# Patient Record
Sex: Male | Born: 1955 | Race: White | Hispanic: No | Marital: Married | Smoking: Never smoker
Health system: Southern US, Community
[De-identification: ages and names within clinical notes are randomized; demographics above are authoritative.]

## PROBLEM LIST (undated history)

## (undated) DIAGNOSIS — E785 Hyperlipidemia, unspecified: Secondary | ICD-10-CM

## (undated) HISTORY — DX: Hyperlipidemia, unspecified: E78.5

---

## 2015-07-07 ENCOUNTER — Emergency Department (HOSPITAL_COMMUNITY): Payer: Self-pay

## 2015-07-07 ENCOUNTER — Emergency Department (HOSPITAL_COMMUNITY)
Admission: EM | Admit: 2015-07-07 | Discharge: 2015-07-07 | Disposition: A | Discharge status: Home / Self Care | Payer: Self-pay | Attending: Emergency Medicine | Admitting: Emergency Medicine

## 2015-07-07 DIAGNOSIS — S70351A Superficial foreign body, right thigh, initial encounter: Secondary | ICD-10-CM | POA: Insufficient documentation

## 2015-07-07 DIAGNOSIS — Y9389 Activity, other specified: Secondary | ICD-10-CM | POA: Insufficient documentation

## 2015-07-07 DIAGNOSIS — Y998 Other external cause status: Secondary | ICD-10-CM | POA: Insufficient documentation

## 2015-07-07 DIAGNOSIS — S71121A Laceration with foreign body, right thigh, initial encounter: Secondary | ICD-10-CM | POA: Insufficient documentation

## 2015-07-07 DIAGNOSIS — Z23 Encounter for immunization: Secondary | ICD-10-CM | POA: Insufficient documentation

## 2015-07-07 DIAGNOSIS — W208XXA Other cause of strike by thrown, projected or falling object, initial encounter: Secondary | ICD-10-CM | POA: Insufficient documentation

## 2015-07-07 DIAGNOSIS — M795 Residual foreign body in soft tissue: Secondary | ICD-10-CM

## 2015-07-07 DIAGNOSIS — Y9289 Other specified places as the place of occurrence of the external cause: Secondary | ICD-10-CM | POA: Insufficient documentation

## 2015-07-07 MED ORDER — OXYCODONE-ACETAMINOPHEN 5-325 MG PO TABS
1.0000 | ORAL_TABLET | Freq: Once | ORAL | Status: AC
  Administered 2015-07-07: 1 via ORAL
  Filled 2015-07-07: qty 1

## 2015-07-07 MED ORDER — DOXYCYCLINE HYCLATE 100 MG PO TABS
100.0000 mg | ORAL_TABLET | Freq: Once | ORAL | Status: AC
  Administered 2015-07-07: 100 mg via ORAL
  Filled 2015-07-07: qty 1

## 2015-07-07 MED ORDER — BUPIVACAINE HCL (PF) 0.5 % IJ SOLN
20.0000 mL | Freq: Once | INTRAMUSCULAR | Status: AC
  Administered 2015-07-07: 20 mL
  Filled 2015-07-07: qty 20

## 2015-07-07 MED ORDER — TETANUS-DIPHTH-ACELL PERTUSSIS 5-2.5-18.5 LF-MCG/0.5 IM SUSP
0.5000 mL | Freq: Once | INTRAMUSCULAR | Status: AC
  Administered 2015-07-07: 0.5 mL via INTRAMUSCULAR
  Filled 2015-07-07: qty 0.5

## 2015-07-07 MED ORDER — OXYCODONE-ACETAMINOPHEN 5-325 MG PO TABS: 1.0000 | ORAL_TABLET | ORAL | Status: DC | PRN

## 2015-07-07 MED ORDER — DOXYCYCLINE HYCLATE 100 MG PO CAPS: 100.0000 mg | ORAL_CAPSULE | Freq: Two times a day (BID) | ORAL | Status: DC

## 2015-07-07 MED ORDER — IBUPROFEN 800 MG PO TABS: 800.0000 mg | ORAL_TABLET | Freq: Three times a day (TID) | ORAL | Status: DC
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
## 2015-07-07 NOTE — Discharge Instructions (Signed)
You have been seen today for a laceration and foreign body removal.. The follow-up x-ray showed no evidence of remaining foreign body. A PCP in follow-up with his PCP or return to the ED in 3 days for a wound check. Return to ED should symptoms worsen or signs of infection arise, such as redness expanding out from the root wound or red streaks, intense pain, discharge from the wound, or any other major concerns. Return to the ED should you have bleeding that does not stop with at least 10 minutes of direct pressure. Return to the ED immediately by ambulance if you have a large amount of flowing or spurting blood. Please take all of your antibiotics until finished!   You may develop abdominal discomfort or diarrhea from the antibiotic.  You may help offset this with probiotics which you can buy or get in yogurt. Do not eat or take the probiotics until 2 hours after your antibiotic.    Emergency Department Resource Guide 1) Find a Doctor and Pay Out of Pocket Although you won't have to find out who is covered by your insurance plan, it is a good idea to ask around and get recommendations. You will then need to call the office and see if the doctor you have chosen will accept you as a new patient and what types of options they offer for patients who are self-pay. Some doctors offer discounts or will set up payment plans for their patients who do not have insurance, but you will need to ask so you aren't surprised when you get to your appointment.  2) Contact Your Local Health Department Not all health departments have doctors that can see patients for sick visits, but many do, so it is worth a call to see if yours does. If you don't know where your local health department is, you can check in your phone book. The CDC also has a tool to help you locate your state's health department, and many state websites also have listings of all of their local health departments.  3) Find a Walk-in Clinic If your illness  is not likely to be very severe or complicated, you may want to try a walk in clinic. These are popping up all over the country in pharmacies, drugstores, and shopping centers. They're usually staffed by nurse practitioners or physician assistants that have been trained to treat common illnesses and complaints. They're usually fairly quick and inexpensive. However, if you have serious medical issues or chronic medical problems, these are probably not your best option.  No Primary Care Doctor: - Call Health Connect at  [phone] - they can help you locate a primary care [phone]  accepts your insurance, provides certain services, etc. - Physician Referral Service- [phone]  Chronic Pain Problems: Organization         Address  Phone   Notes  [facility]  [phone] Patients need to be referred by their primary care doctor.   Medication Assistance: Organization         Address  Phone   Notes  [facility] [address] [place], [place] [address] [phone] --Must be a resident of [facility] -- Must have NO insurance coverage whatsoever (no Medicaid/ Medicare, etc.) -- The pt. MUST have a primary care doctor that directs their care regularly and follows them in the community   [facility]  [phone]   [address]  [phone]  Agencies that provide inexpensive medical care: Organization         Address  Phone   Notes  [facility]  [phone]   [facility]    [phone]   [facility] [address] [place], [place] [address] [phone]   [facility] [address], [place] [phone]   Planned Parenthood    [phone]   [facility]    [phone]   Community Health and [facility]  [address], [place] Phone:  [phone], Fax:  [phone] Hours of Operation:  9 am - 6  pm, M-F.  Also accepts Medicaid/Medicare and self-pay.  [facility]  [address], [place] Phone: [phone], Fax: [phone]. Hours of Operation:  8:30 am - 5:30 pm, M-F.  Also accepts Medicaid and self-pay.  [facility] [place] [address] [place] Phone: [phone]   [facility] [address] Natasha Bence [place], [place] [phone], Ext. [phone] Mondays & Thursdays: 7-9 AM.  First 15 patients are seen on a first come, first serve basis.    Medicaid-accepting [facility] Providers:  Organization         Address  Phone   Notes  [facility] [address], [place] [phone] Also accepts self-pay patients.  [facility] [address] Laurell Josephs [place], [place]  [phone]   [facility] [address], [place] [phone]   [facility] [address], [place] [phone]   Renaye Rakers [address], [place]   [phone] Only accepts [place] Access [place] patients after they have their name applied to their card.   Self-Pay (no insurance) in [facility] Series:  Organization         Address  Phone   Notes  Sickle Cell Patients, [facility] [address] [place], [place] [phone]   [facility] [address] [place], [place] [phone]   [facility] [place]  [address], [place] [phone]   [facility]/Dr. Osei-Bonsu  [address] [place] or [address], [place] [phone] Phone number for both [place] and [place] locations is the same.  Urgent Medical and [facility] [address], [place] [phone]   [facility] [address], [place] or [address] [phone]   [facility] [address], [place] [phone], phone; [phone], fax Sees patients 1st and 3rd Saturday of every month.  Must not qualify for public or private insurance (i.e. Medicaid, Medicare, [employer] Health Choice, Veterans' Benefits)  Household income should be no more than 200% of the poverty level The clinic cannot treat you if you are pregnant or think you are pregnant  Sexually transmitted diseases are not treated at the clinic.    Dental Care: Organization         Address  Phone  Notes  [facility] Department of [facility] [address] [place], [place] [phone] Accepts children up to age 61 who are enrolled in [place] or [place] Health Choice; pregnant women with a Medicaid card; and children who have applied for Medicaid or  [place] Health Choice, but were declined, whose parents can pay a reduced fee at time of service.  [facility] Department of [facility]  [address], [place] [phone] Accepts children up to age 4 who are enrolled in [place] or [place] Health Choice; pregnant women with a Medicaid card; and children who have applied for Medicaid or  Health Choice, but were declined, whose parents can pay a reduced fee at time of service.  [facility]  [address] [place], [place] [phone] Patients are seen by appointment only. Walk-ins are not accepted. [facility] will see patients 66 years of age and older. Monday - Tuesday (8am-5pm) Most Wednesdays (8:30-5pm) $30 per visit, cash only  [facility] Access [facility]  [address], [facility] [phone] Patients are seen by appointment only. Walk-ins are not accepted. [facility] will see patients 36 years of age and older. One Wednesday Evening (Monthly: Volunteer Based).  $30 per visit, cash only  [employer]  [phone] for adults; Children under age 78, call Graduate Pediatric Dentistry at [phone]. Children aged 12-14, please call [phone] to request a pediatric application.  Dental services are provided in all areas of dental care including fillings, crowns and bridges, complete and partial dentures, implants, gum treatment, root canals, and extractions. Preventive care is also provided. Treatment is provided to both adults and children. Patients are selected via a lottery and there is often a waiting list.   [facility] [address] [place]  [phone] [email or web address]   [facility] [address] [place], [place] [phone], Ext. [phone] Second and Fourth Thursday of each month, opens at 6:30 AM; Clinic ends at 9 AM.  Patients are seen on a first-come first-served basis, and a limited number are seen during each clinic.   [facility]  [address] Ether Griffins [place], [place] [phone]   Eligibility Requirements You must have lived in [place], [place], or [place] counties for at least the last three months.   You cannot be eligible for state or federal sponsored [place], including [employer], [place], or [employer].   You generally cannot be eligible for healthcare insurance through your employer.    How to apply: Eligibility screenings are held every Tuesday and Wednesday afternoon from 1:00 pm until 4:00 pm. You do not need an appointment for the interview!  [facility] [address] [place], [place] [phone]   [facility] Health Department  [phone]   [facility] Health [facility]  [phone]   [facility] Health Department  [phone]    Behavioral Health Resources in the Community: Intensive Outpatient Programs Organization         Address  Phone  Notes  [facility] [address] [place], [place] [phone]   [facility] Outpatient [address], [place], [place] [phone]   [facility]  [address] [place], [place]  [phone]   [facility] [address]  [place], [place] [phone] or [phone]   Substance Abuse Resources Organization         Address  Phone  Notes  [facility] Drug Services  [phone]   [facility]  [phone]   [facility] [place]  [phone]   Floydene Flock  [phone]   Residential & Outpatient Substance Abuse Program  [phone]  Psychological Services Organization         Address  Phone  Notes  [facility] [place]  [place]  [phone]   [place] [address] [place], [place] or [phone]    Mobile Crisis Teams Organization         Address  Phone  Notes  [facility], Mobile Crisis Care Unit  [phone]   [facility]  [address] [place], [place]   [place] [address], [place] [phone]    Self-Help/Support Groups Organization         Address  Phone             Notes  [place] of [place] - variety of support groups  [place] Call for more information  Narcotics Anonymous (NA), [facility] [address], [employer] [place]  2 meetings at this location   Special educational needs teacher         Address  Phone  Notes  [facility] [place],    [place]  [phone]   [facility]  [address] [place] [phone], [place], [place]   [place], [place] [phone] Admissions: 8am-3pm M-F  Incentives Substance [place] [address]    [employer], [place] [phone]   [facility] [address] [place], [place], [place]   [facility] [address]  [place], [place]   [facility] - Intensive Outpatient [address], [place], [place]   [facility] [place]  [place], [place] [phone] or [phone]   [facility] ([facility]) [address] [place], [place] Accepts Medicaid  [facility] [place] [address],  [place] 1-6307513400 Substance Abuse/Addiction Treatment   [facility] Organization         Address  Phone  Notes  [facility]  [phone]   Domenic Schwab, PhD [address] Arlis Porta [place], [place]   [phone] or [phone]   [place], [place] [phone]   [facility] [address], [place], [place] [phone] Insurance/Medicaid/sponsorship through [facility] and Families [address] [place]                                    [place], [place] [phone] [place] [address], [place] [phone]    Dr. Adele Schilder  [phone]   [facility] Dept. 1) [address] [place] 2) [place] 3)  [place] [address], [place] [phone]  [phone]   [place] [phone] or [phone] (After Hours)

## 2017-07-09 IMAGING — CR DG FEMUR 2+V*R*
4 series · 4 of 4 positions shown · non-contrast
Comparison: Earlier the same day.

CLINICAL DATA: Status post soft tissue foreign body removal.

EXAM:
RIGHT FEMUR 2 VIEWS

[femur ap (1 of 2)]
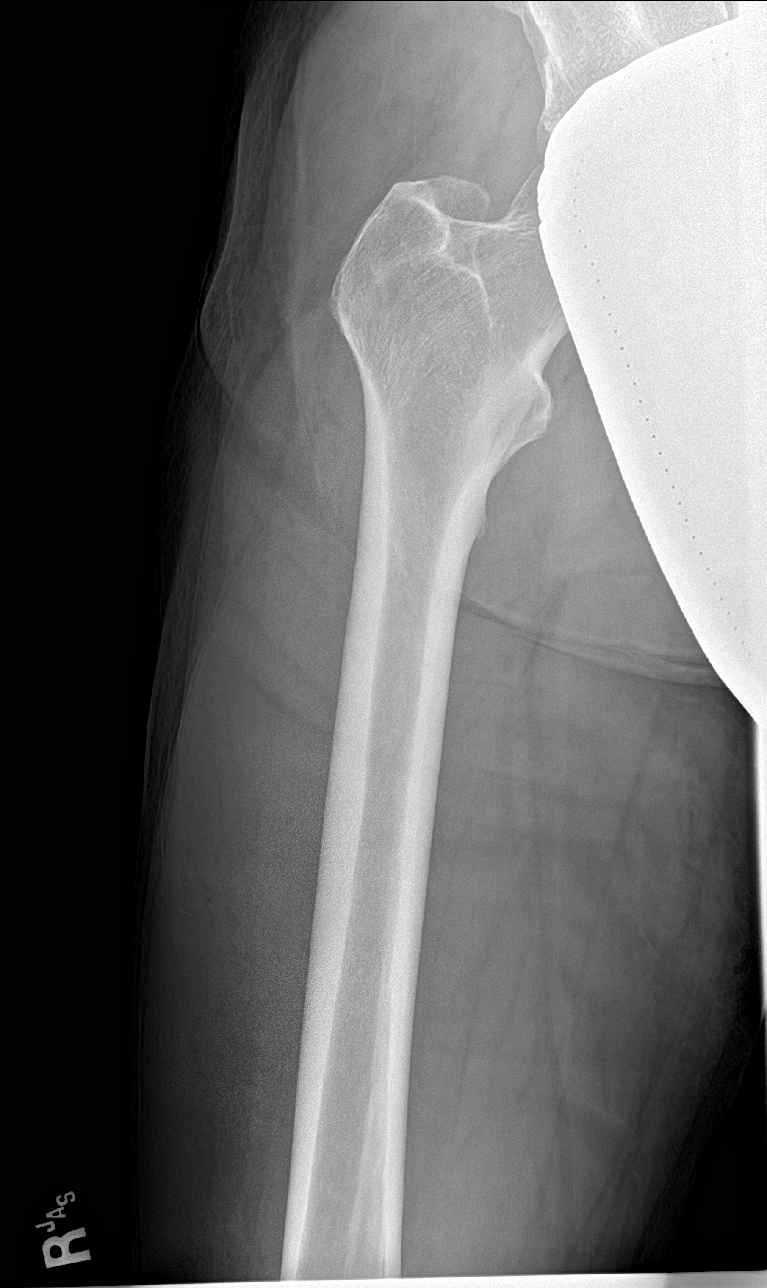

[femur ap (2 of 2)]
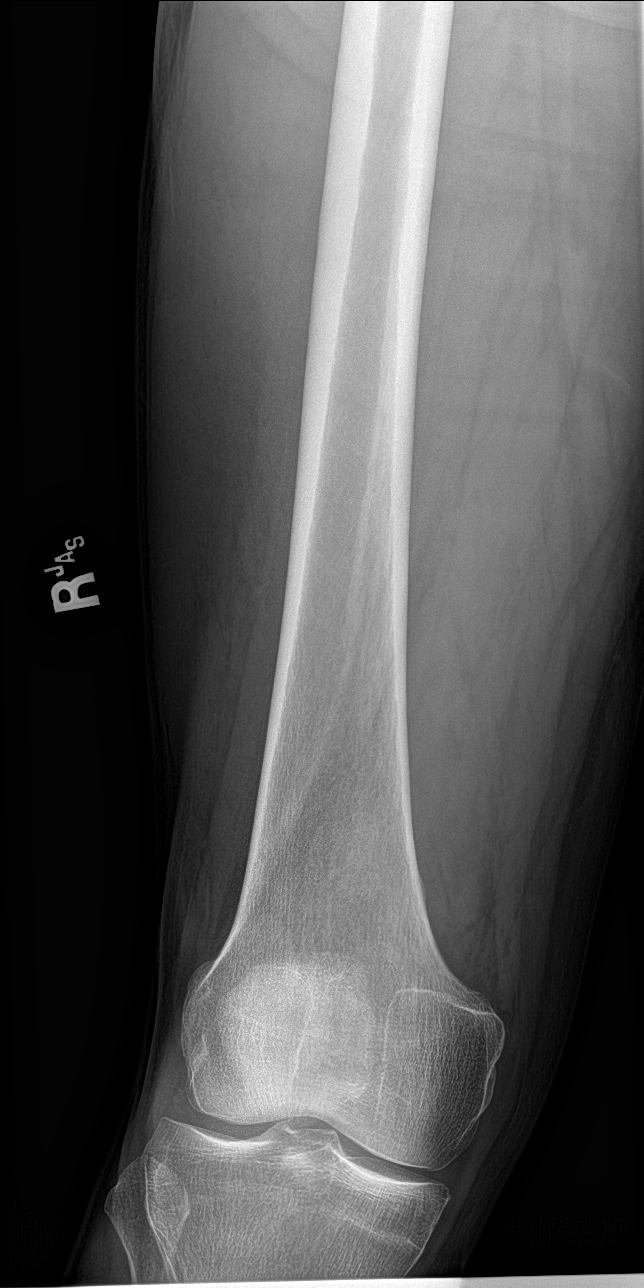

[femur lat (1 of 2)]
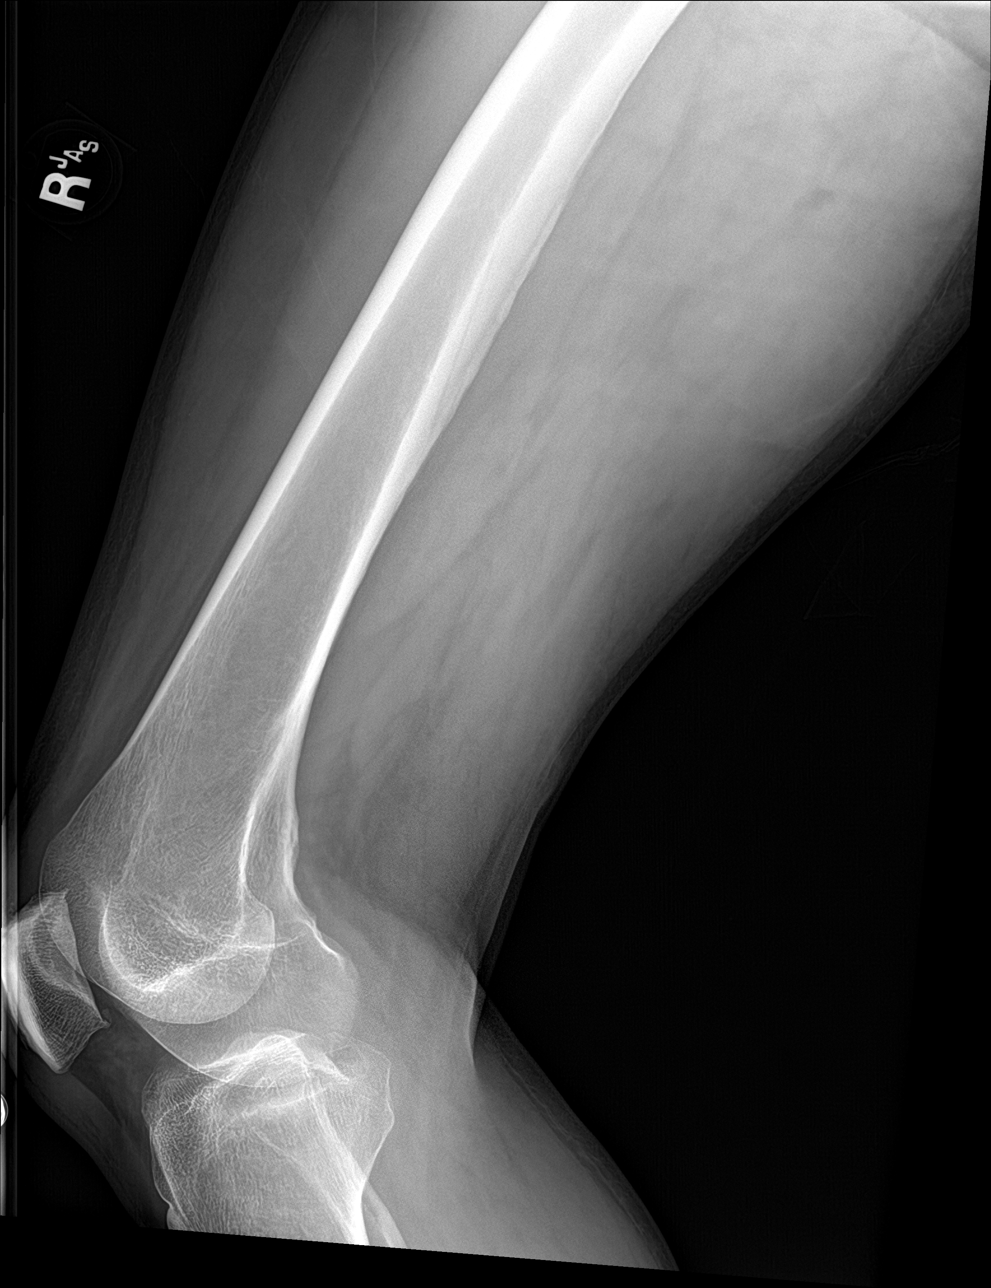

[femur lat (2 of 2)]
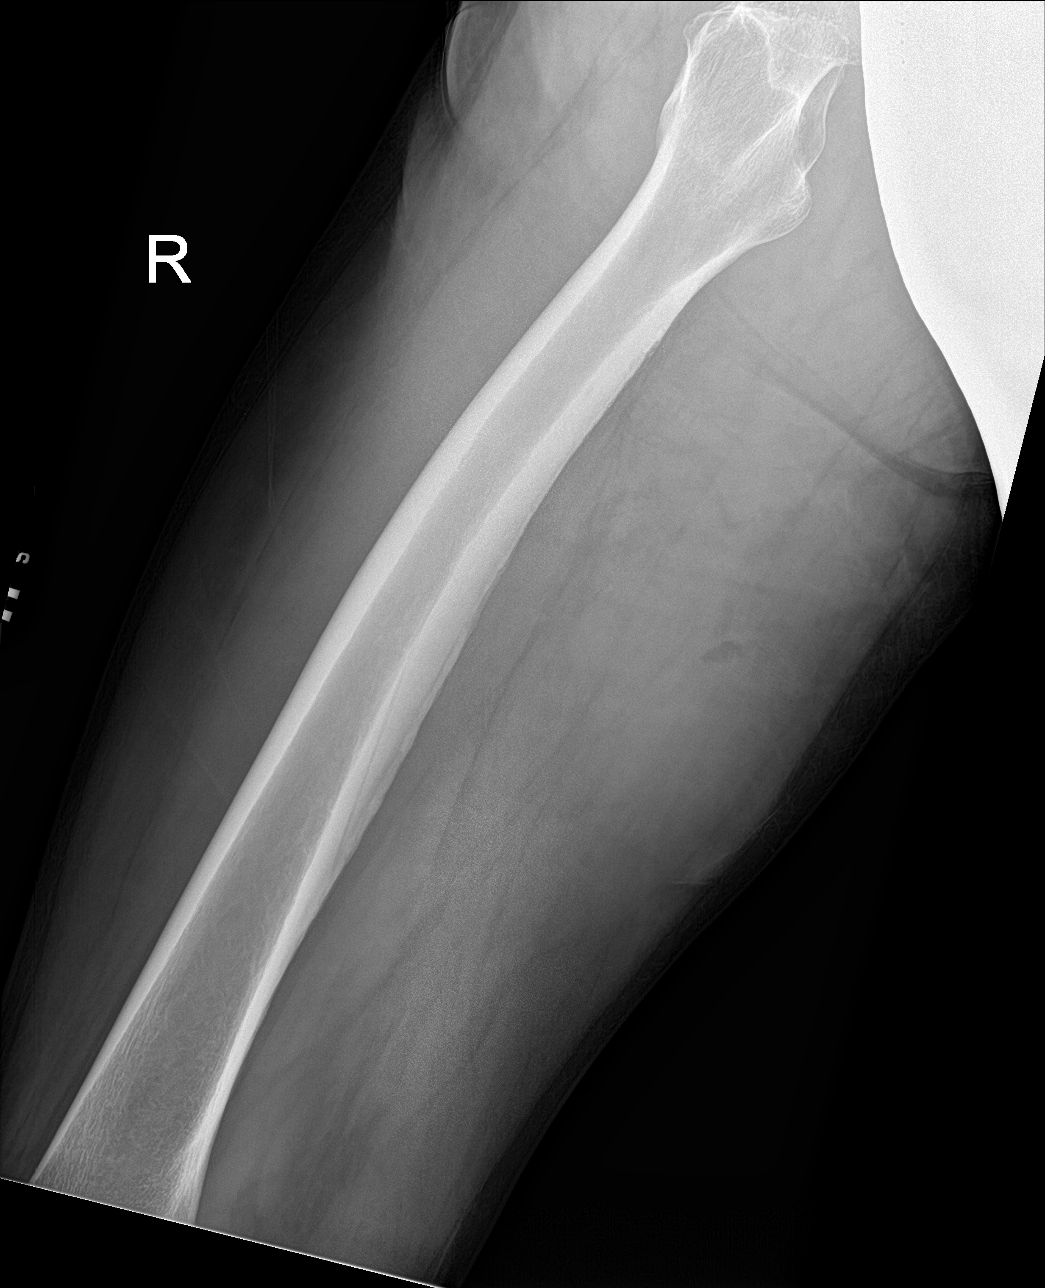

[4 of 4 positions shown; findings below may reference images not displayed]

FINDINGS: The radiopaque soft tissue foreign body seen in the medial thigh on
the previous exam is no longer evident.
IMPRESSION: No evidence for retained radiopaque soft tissue foreign body at the
site of the previous abnormality.

## 2022-03-11 ENCOUNTER — Ambulatory Visit (INDEPENDENT_AMBULATORY_CARE_PROVIDER_SITE_OTHER): Payer: Medicare Other | Admitting: Medical

## 2022-03-11 ENCOUNTER — Encounter: Payer: Self-pay | Admitting: Medical

## 2022-03-11 VITALS — BP 132/80 | HR 76 | Temp 98.0°F | Resp 18 | Ht 71.0 in | Wt 192.2 lb

## 2022-03-11 DIAGNOSIS — E785 Hyperlipidemia, unspecified: Secondary | ICD-10-CM

## 2022-03-11 DIAGNOSIS — K130 Diseases of lips: Secondary | ICD-10-CM

## 2022-03-11 DIAGNOSIS — R030 Elevated blood-pressure reading, without diagnosis of hypertension: Secondary | ICD-10-CM

## 2022-03-11 MED ORDER — NYSTATIN-TRIAMCINOLONE 100000-0.1 UNIT/GM-% EX OINT: 1.0000 | TOPICAL_OINTMENT | Freq: Two times a day (BID) | CUTANEOUS | 0 refills | Status: DC

## 2022-03-11 MED ORDER — NYSTATIN 100000 UNIT/GM EX OINT: 1.0000 | TOPICAL_OINTMENT | Freq: Two times a day (BID) | CUTANEOUS | 0 refills | Status: DC

## 2022-03-11 NOTE — Addendum Note (Signed)
Addended by: Anabel Halon on: 03/11/2022 11:13 AM   Modules accepted: Orders

## 2022-03-11 NOTE — Progress Notes (Signed)
Subjective:    Patient ID: Randy Blake, male    DOB: 1955-09-20, 66 y.o.   MRN: [id number]  HPI Pt in for first time.  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  Served for 3 years. 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 Pt goes to gym 3 times a week. Also some weights at home. Eating healthy. Non smoker. Rare wine.   At [facility] he was told had high cholesterol. With diet and exercise his levels did improve. He states he tried statin in past and felt very bad. Aches all over and felt sluggish. No famiily histoy or strokes or MI. Parents passed away in 90's.  Elevated blood pressure- 4-5 cups of coffee. No history of bp medications. Usually in past 120/70 at [place]. He notices trend bp better without coffee.    Pt describes colonoscopy negative 10 years ago. Also describes just recently did probable cologuard in feb 2023.   Pt getting a lot of dental work. Some excess saliva and now just recently. Dentist thinks when lower teeth are in will improve.    Review of Systems  Constitutional:  Negative for chills and fatigue.  HENT:  Negative for congestion and ear discharge.   Respiratory:  Negative for cough, chest tightness, shortness of breath and wheezing.   Cardiovascular:  Negative for chest pain and palpitations.  Gastrointestinal:  Negative for abdominal pain, diarrhea and nausea.  Genitourinary:  Negative for dysuria, frequency and hematuria.  Musculoskeletal:  Negative for back pain.  Skin:  Negative for rash.  Neurological:  Negative for dizziness, facial asymmetry, weakness and numbness.  Hematological:  Negative for adenopathy. Does not bruise/bleed easily.  Psychiatric/Behavioral:  Negative for confusion, dysphoric mood and hallucinations. The patient is not nervous/anxious.     Past Medical History:  Diagnosis Date   Hyperlipidemia      Social History   Socioeconomic History   Marital status: Married    Spouse name: Not on file   Number of children: Not on file    Years of education: Not on file   Highest education level: Not on file  Occupational History   Occupation: flight instructior  Tobacco Use   Smoking status: Never   Smokeless tobacco: Never  Vaping Use   Vaping Use: Never used  Substance and Sexual Activity   Alcohol use: Not on file   Drug use: Never   Sexual activity: Yes  Other Topics Concern   Not on file  Social History Narrative   Not on file   Social Determinants of Health   Financial Resource Strain: Not on file  Food Insecurity: Not on file  Transportation Needs: Not on file  Physical Activity: Not on file  Stress: Not on file  Social Connections: Not on file  Intimate Partner Violence: Not on file      No family history on file.  Allergies  Allergen Reactions   Zocor [Simvastatin]     No current outpatient medications on file prior to visit.   No current facility-administered medications on file prior to visit.    BP (!) 146/85   Pulse 76   Temp 98 F (36.7 C)   Resp 18   Ht 5\' 11"  (1.803 m)   Wt 192 lb 3.2 oz (87.2 kg)   SpO2 97%   BMI 26.81 kg/m        Objective:   Physical Exam  General Mental Status- Alert. General Appearance- Not in acute distress.   Skin General: Color-  Normal Color. Moisture- Normal Moisture.  Neck Carotid Arteries- Normal color. Moisture- Normal Moisture. No carotid bruits. No JVD.  Chest and Lung Exam Auscultation: Breath Sounds:-Normal.  Cardiovascular Auscultation:Rythm- Regular. Murmurs & Other Heart Sounds:Auscultation of the heart reveals- No Murmurs.  Abdomen Inspection:-Inspeection Normal. Palpation/Percussion:Note:No mass. Palpation and Percussion of the abdomen reveal- Non Tender, Non Distended + BS, no rebound or guarding.   Neurologic Cranial Nerve exam:- CN III-XII intact(No nystagmus), symmetric smile. Strength:- 5/5 equal and symmetric strength both upper and lower extremities.       Assessment & Plan:   Patient Instructions   For high cholesterol will get cmp and lipid panel.   For elevated blood pressure today do want you to check your blood pressure  every other day and update me on bp level in one week.  Continue healthy diet and regular exercise.  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 Will rx nystatin ointment to use twice daily for one week. In one week update me on how areas are doing. If improved can go to 3 days a week use. Referral to ENT as option as well. Do think you will have less of issue after you have all your dental work is done.  Follow up in 3 weeks or sooner if needed.

## 2022-03-11 NOTE — Patient Instructions (Addendum)
For high cholesterol will get cmp and lipid panel.   For elevated blood pressure today do want you to check your blood pressure  every other day and update me on bp level in one week.  Continue healthy diet and regular exercise.  Would ask you sign release of information form so we can get prior records from [place] do appear to have angular cheilitis. Will rx nystatin ointment to use twice daily for one week. In one week update me on how areas are doing. If improved can go to 3 days a week use. Referral to ENT as option as well. Do think you will have less of issue after you have all your dental work is done.  Follow up in 3 weeks or sooner if needed.

## 2022-03-25 ENCOUNTER — Ambulatory Visit (HOSPITAL_BASED_OUTPATIENT_CLINIC_OR_DEPARTMENT_OTHER)
Admission: RE | Admit: 2022-03-25 | Discharge: 2022-03-25 | Disposition: A | Discharge status: Home / Self Care | Payer: Medicare Other | Source: Ambulatory Visit | Attending: Medical | Admitting: Medical

## 2022-03-25 ENCOUNTER — Ambulatory Visit (INDEPENDENT_AMBULATORY_CARE_PROVIDER_SITE_OTHER): Payer: Medicare Other | Admitting: Medical

## 2022-03-25 ENCOUNTER — Encounter: Payer: Self-pay | Admitting: Medical

## 2022-03-25 VITALS — BP 130/80 | HR 98 | Temp 98.2°F | Resp 18 | Ht 71.0 in | Wt 194.0 lb

## 2022-03-25 DIAGNOSIS — M79672 Pain in left foot: Secondary | ICD-10-CM

## 2022-03-25 NOTE — Progress Notes (Signed)
   Subjective:    Patient ID: Randy Blake, male    DOB: [birth year], 66 y.o.   MRN: [id number]  HPI  Pt in for some foot pain left side. He describes 4 month ago he stubbed 2nd  and 3rd  toe. He states toes were bruised. He did not get evaluated. After bruising went away he describes swelling sensation below toes on his foot. Pain hurts now intermittently.   Pt level can be 3/10 when walks. Some pain most notible at night.  Pt has some improvement with excessive salivation. He is using claritin and nystatin. Pt dental work is pending.      Review of Systems  Constitutional:  Negative for chills, fatigue and fever.  Respiratory:  Negative for cough, chest tightness, shortness of breath and wheezing.   Cardiovascular:  Negative for chest pain and palpitations.  Gastrointestinal:  Negative for abdominal pain.  Musculoskeletal:  Negative for back pain.       Left foot pain.       Objective:   Physical Exam  General- No acute distress. Pleasant patient. Left foot- no obvious swelling or pain at distal metatarsal head region presently. No redness.       Assessment & Plan:   Patient Instructions  For left foot pain will get xray of foot to evaluate metatarsal head region and assess joint spaces. Offered low dose ibuprofen 200 mg or tylenol 250 every 8 hours if needed. Can use complete Dr. Darrick Grinder heel pad.   After review of xray may refer you to podiatrist.  Can continue claritin and nystatin ointment pending completion of dental procedures.  Follow up date to be determined after xray review.   Mackie Pai, PA-C

## 2022-03-25 NOTE — Patient Instructions (Addendum)
For left foot pain will get xray of foot to evaluate metatarsal head region and assess joint spaces. Offered low dose ibuprofen 200 mg or tylenol 250 every 8 hours if needed. Can use complete Dr. Darrick Grinder heel pad.  After review of xray may refer you to podiatrist.  Can continue claritin and nystatin ointment pending completion of dental procedures.  Follow up date to be determined after xray review.

## 2022-03-26 ENCOUNTER — Encounter: Payer: Self-pay | Admitting: Medical

## 2022-04-11 ENCOUNTER — Encounter: Payer: Self-pay | Admitting: Medical

## 2022-04-13 MED ORDER — MUPIROCIN 2 % EX OINT: 1.0000 | TOPICAL_OINTMENT | Freq: Two times a day (BID) | CUTANEOUS | 0 refills | Status: DC

## 2022-04-13 NOTE — Addendum Note (Signed)
Addended by: Anabel Halon on: 04/13/2022 10:02 PM   Modules accepted: Orders

## 2022-04-29 ENCOUNTER — Ambulatory Visit: Payer: Medicare Other | Admitting: Medical

## 2022-04-30 ENCOUNTER — Ambulatory Visit (INDEPENDENT_AMBULATORY_CARE_PROVIDER_SITE_OTHER): Payer: Medicare Other | Admitting: Medical

## 2022-04-30 VITALS — BP 120/80 | HR 76 | Temp 98.0°F | Resp 18 | Ht 71.0 in | Wt 201.0 lb

## 2022-04-30 DIAGNOSIS — L03012 Cellulitis of left finger: Secondary | ICD-10-CM | POA: Diagnosis not present

## 2022-04-30 DIAGNOSIS — K130 Diseases of lips: Secondary | ICD-10-CM

## 2022-04-30 MED ORDER — DOXYCYCLINE HYCLATE 100 MG PO TABS: 100.0000 mg | ORAL_TABLET | Freq: Two times a day (BID) | ORAL | 0 refills | Status: DC

## 2022-04-30 NOTE — Patient Instructions (Addendum)
Paronychia of left thumb and describe some of discoloration earlier 2 weeks more toward pad. Rx doxycycline antibiotic. Rx advisement given. Can soak thumb in warm salt water twice a day. Presently I don't think ingrown nail causing the infection.  Persistent angular cheilitis. Some improvement with mupirocin recently. Can continue. May get some secondary benefit from above antibiotic. Still think dental work getting done may be playing a role.  Will go ahead and place referral to dermatologist. If they don't call you by 7-10 days let me know. Also let me know date of your appointment.  Follow up in 7-10 days or sooner if needed.

## 2022-04-30 NOTE — Progress Notes (Signed)
   Subjective:    Patient ID: Randy Blake, male    DOB: 22-Nov-1955, 66 y.o.   MRN: [id number]  HPI  Pt has left thumb lateral aspect tenderness. He describe some redness and dark color to area about 2 weeks ago. Now less than before. But 3-4 days ago was throbbing. He applied neosporin no known trauma.  Pt still has some angular cheilitis to corners of his mouth. His wife won't kiss him due to concern for infection.  Review of Systems     Objective:   Physical Exam   General- No acute distress. Pleasant patient. Neck- Full range of motion, no jvd Lungs- Clear, even and unlabored. Heart- regular rate and rhythm. Neurologic- CNII- XII grossly intact.  Left thumb- lateral edge of thumb nail mild redness. Pad is not red Heent- corners of mouth angular chelietis appears some improved both sides     Assessment & Plan:   Patient Instructions  Paronychia of left thumb and describe some of discoloration earlier 2 weeks more toward pad. Rx doxycycline antibiotic. Rx advisiment given. Can soak thumb in warm salt water twice a day. Presently I don't think ingrown nail causing the infection.  Persistent angular cheilitis. Some improvement with mupirocin recently. Can continue. May get some secondary benefit from above antibiotic. Still think dental work getting done may be playing a role.  Will go ahead and place referral to dermatologist. If they don't call you by 7-10 days let me know. Also let me know date of your appointment.  Follow up in 7-10 days or sooner if needed.     Esperanza Richters, PA-C

## 2022-06-27 DIAGNOSIS — K130 Diseases of lips: Secondary | ICD-10-CM | POA: Diagnosis not present

## 2022-08-14 DIAGNOSIS — K080 Exfoliation of teeth due to systemic causes: Secondary | ICD-10-CM | POA: Diagnosis not present

## 2022-08-20 DIAGNOSIS — K080 Exfoliation of teeth due to systemic causes: Secondary | ICD-10-CM | POA: Diagnosis not present

## 2022-08-28 DIAGNOSIS — K080 Exfoliation of teeth due to systemic causes: Secondary | ICD-10-CM | POA: Diagnosis not present

## 2022-09-10 ENCOUNTER — Ambulatory Visit: Payer: Self-pay | Admitting: Medical

## 2023-01-20 ENCOUNTER — Ambulatory Visit: Payer: Medicare Other | Admitting: Medical

## 2023-01-20 ENCOUNTER — Encounter: Payer: Self-pay | Admitting: Medical

## 2023-01-20 VITALS — BP 135/70 | HR 62 | Ht 71.0 in | Wt 197.4 lb

## 2023-01-20 DIAGNOSIS — R351 Nocturia: Secondary | ICD-10-CM | POA: Diagnosis not present

## 2023-01-20 DIAGNOSIS — R5383 Other fatigue: Secondary | ICD-10-CM | POA: Diagnosis not present

## 2023-01-20 DIAGNOSIS — E785 Hyperlipidemia, unspecified: Secondary | ICD-10-CM

## 2023-01-20 NOTE — Patient Instructions (Addendum)
1. Fatigue, unspecified type Follow lab. Minimal fatigue at best. - CBC w/Diff; Future -EKG- sinus rhythm. Mid bradycardia. No skipped beats. Normal pr interval. Pt exercises regularly and waited some time in office to get EKG.  2. Hyperlipidemia, unspecified hyperlipidemia type Follow labs and update you on result. Statin prior side effects. - Comp Met (CMET); Future - Lipid panel; Future  3. Frequent urination at night Follow psa. If elevated refer to urologist. - PSA; Future   Follow up date to be determined after lab review.

## 2023-01-20 NOTE — Addendum Note (Signed)
Addended by: Dorris Fetch on: 01/20/2023 10:05 AM   Modules accepted: Orders

## 2023-01-20 NOTE — Progress Notes (Addendum)
Subjective:    Patient ID: Randy Blake, male    DOB: 07/06/1955, 67 y.o.   MRN: [id number]  HPI  Pt in for follow up.  I have not seen pt since 04-30-2022. Pt expresses he wants physical. Pt has medicare .  Pt also attends [place] but too hard to get into [place].    History of high cholesterol in the past. He states controlled with diet alone. Pt states statin caused achiness and fatigue in the past. Overall healthy diet and exercise. Rare occasional glass of wine.  Pt states rare occasional mild fatigue.  Some mild frequent urination at night.(Urinate up to twice at night routinely)  Pt states he had cologuard done last year. He states was negative. Pt states [facility].  Pt request EKG. No cardiac symptoms. No dyspnea.   Review of Systems  Constitutional:  Positive for fatigue. Negative for chills and fever.       Minimal occasional fatigue at times.  Respiratory:  Negative for cough, chest tightness, shortness of breath and wheezing.   Cardiovascular:  Negative for chest pain and palpitations.  Gastrointestinal:  Negative for abdominal pain.  Musculoskeletal:  Negative for back pain, myalgias and neck pain.  Skin:  Negative for rash.  Neurological:  Negative for dizziness, speech difficulty and light-headedness.  Hematological:  Negative for adenopathy. Does not bruise/bleed easily.  Psychiatric/Behavioral:  Negative for behavioral problems, decreased concentration and suicidal ideas. The patient is not nervous/anxious.    Past Medical History:  Diagnosis Date   Hyperlipidemia      Social History   Socioeconomic History   Marital status: Married    Spouse name: Not on file   Number of children: Not on file   Years of education: Not on file   Highest education level: Not on file  Occupational History   Occupation: flight instructior  Tobacco Use   Smoking status: Never   Smokeless tobacco: Never  Vaping Use   Vaping status: Never Used  Substance and  Sexual Activity   Alcohol use: Not on file   Drug use: Never   Sexual activity: Yes  Other Topics Concern   Not on file  Social History Narrative   Not on file   Social Determinants of Health   Financial Resource Strain: Not on file  Food Insecurity: Not on file  Transportation Needs: Not on file  Physical Activity: Not on file  Stress: Not on file  Social Connections: Not on file  Intimate Partner Violence: Not on file      No family history on file.  Allergies  Allergen Reactions   Zocor [Simvastatin]     Current Outpatient Medications on File Prior to Visit  Medication Sig Dispense Refill   doxycycline (VIBRA-TABS) 100 MG tablet Take 1 tablet (100 mg total) by mouth 2 (two) times daily. Can give caps or generic 20 tablet 0   mupirocin ointment (BACTROBAN) 2 % Apply 1 Application topically 2 (two) times daily. 22 g 0   nystatin ointment (MYCOSTATIN) Apply 1 Application topically 2 (two) times daily. 30 g 0   No current facility-administered medications on file prior to visit.    BP 135/70   Pulse 62   Ht 5\' 11"  (1.803 m)   Wt 197 lb 6.4 oz (89.5 kg)   SpO2 97%   BMI 27.53 kg/m           Objective:   Physical Exam  General Mental Status- Alert. General Appearance- Not  in acute distress.   Skin Small scattered moles on anterior and posterior thorax.  Neck Carotid Arteries- Normal color. Moisture- Normal Moisture. No carotid bruits. No JVD.  Chest and Lung Exam Auscultation: Breath Sounds:-Normal.  Cardiovascular Auscultation:Rythm- Regular. Murmurs & Other Heart Sounds:Auscultation of the heart reveals- No Murmurs.  Abdomen Inspection:-Inspeection Normal. Palpation/Percussion:Note:No mass. Palpation and Percussion of the abdomen reveal- Non Tender, Non Distended + BS, no rebound or guarding.   Neurologic Cranial Nerve exam:- CN III-XII intact(No nystagmus), symmetric smile. Strength:- 5/5 equal and symmetric strength both upper and lower  extremities.       Assessment & Plan:   Patient Instructions  1. Fatigue, unspecified type Follow lab. Minimal fatigue at best. - CBC w/Diff; Future  2. Hyperlipidemia, unspecified hyperlipidemia type Follow labs and update you on result. Statin prior side effects. - Comp Met (CMET); Future - Lipid panel; Future  3. Frequent urination at night Follow psa. If elevated refer to urologist. - PSA; Future   Follow up date to be determined after lab review.  Esperanza Richters, PA-C

## 2023-01-20 NOTE — Addendum Note (Signed)
Addended by: Gwenevere Abbot on: 01/20/2023 10:18 AM   Modules accepted: Orders

## 2023-01-22 ENCOUNTER — Other Ambulatory Visit (INDEPENDENT_AMBULATORY_CARE_PROVIDER_SITE_OTHER): Payer: Medicare Other

## 2023-01-22 DIAGNOSIS — E785 Hyperlipidemia, unspecified: Secondary | ICD-10-CM | POA: Diagnosis not present

## 2023-01-22 DIAGNOSIS — R5383 Other fatigue: Secondary | ICD-10-CM | POA: Diagnosis not present

## 2023-01-22 DIAGNOSIS — R351 Nocturia: Secondary | ICD-10-CM | POA: Diagnosis not present

## 2023-01-22 LAB — COMPREHENSIVE METABOLIC PANEL
ALT: 11 U/L (ref 0–53)
AST: 13 U/L (ref 0–37)
Albumin: 4.5 g/dL (ref 3.5–5.2)
Alkaline Phosphatase: 45 U/L (ref 39–117)
BUN: 15 mg/dL (ref 6–23)
CO2: 29 mEq/L (ref 19–32)
Calcium: 9.3 mg/dL (ref 8.4–10.5)
Chloride: 103 mEq/L (ref 96–112)
Creatinine, Ser: 1.09 mg/dL (ref 0.40–1.50)
GFR: 69.49 mL/min (ref >60.00)
Glucose, Bld: 93 mg/dL (ref 70–99)
Potassium: 4.2 mEq/L (ref 3.5–5.1)
Sodium: 138 mEq/L (ref 135–145)
Total Bilirubin: 0.7 mg/dL (ref 0.2–1.2)
Total Protein: 6.9 g/dL (ref 6.0–8.3)

## 2023-01-22 LAB — CBC WITH DIFFERENTIAL/PLATELET
Basophils Absolute: 0 10*3/uL (ref 0.0–0.1)
Basophils Relative: 0.6 % (ref 0.0–3.0)
Eosinophils Absolute: 0.2 10*3/uL (ref 0.0–0.7)
Eosinophils Relative: 4.3 % (ref 0.0–5.0)
HCT: 42.1 % (ref 39.0–52.0)
Hemoglobin: 13.8 g/dL (ref 13.0–17.0)
Lymphocytes Relative: 40.2 % (ref 12.0–46.0)
Lymphs Abs: 2 10*3/uL (ref 0.7–4.0)
MCHC: 32.9 g/dL (ref 30.0–36.0)
MCV: 95.9 fl (ref 78.0–100.0)
Monocytes Absolute: 0.4 10*3/uL (ref 0.1–1.0)
Monocytes Relative: 7.9 % (ref 3.0–12.0)
Neutro Abs: 2.3 10*3/uL (ref 1.4–7.7)
Neutrophils Relative %: 47 % (ref 43.0–77.0)
Platelets: 234 10*3/uL (ref 150.0–400.0)
RBC: 4.39 Mil/uL (ref 4.22–5.81)
RDW: 12.1 % (ref 11.5–15.5)
WBC: 4.9 10*3/uL (ref 4.0–10.5)

## 2023-01-22 LAB — LIPID PANEL
Cholesterol: 307 mg/dL — ABNORMAL HIGH (ref 0–200)
HDL: 53.1 mg/dL (ref >39.00)
NonHDL: 253.91
Total CHOL/HDL Ratio: 6
Triglycerides: 297 mg/dL — ABNORMAL HIGH (ref 0.0–149.0)
VLDL: 59.4 mg/dL — ABNORMAL HIGH (ref 0.0–40.0)

## 2023-01-22 LAB — LDL CHOLESTEROL, DIRECT: Direct LDL: 237 mg/dL

## 2023-01-22 LAB — PSA: PSA: 0.41 ng/mL (ref 0.10–4.00)

## 2023-01-25 MED ORDER — EZETIMIBE 10 MG PO TABS: 10.0000 mg | ORAL_TABLET | Freq: Every day | ORAL | 3 refills | Status: DC

## 2023-01-25 NOTE — Addendum Note (Signed)
Addended by: Gwenevere Abbot on: 01/25/2023 08:28 PM   Modules accepted: Orders

## 2023-03-05 ENCOUNTER — Ambulatory Visit: Payer: Medicare Other | Admitting: Medical

## 2023-06-06 DIAGNOSIS — H524 Presbyopia: Secondary | ICD-10-CM | POA: Diagnosis not present

## 2023-12-02 NOTE — Progress Notes (Signed)
 [facility] Quality Team Note  Name: Randy Blake Date of Birth: 1955/07/27 MRN: [id number] Date: 12/02/2023  [facility] Quality Team has reviewed this patient's chart, please see recommendations below:  [facility] Quality Other; (CHART REVIEWED FOR COLORECTAL CANCER SCREENING. NO RECORD FOUND.)

## 2024-03-15 ENCOUNTER — Ambulatory Visit

## 2024-03-15 ENCOUNTER — Other Ambulatory Visit: Payer: Self-pay | Admitting: Podiatry

## 2024-03-15 ENCOUNTER — Ambulatory Visit: Admitting: Podiatry

## 2024-03-15 DIAGNOSIS — M778 Other enthesopathies, not elsewhere classified: Secondary | ICD-10-CM

## 2024-03-15 DIAGNOSIS — M7751 Other enthesopathy of right foot: Secondary | ICD-10-CM

## 2024-03-15 DIAGNOSIS — M7742 Metatarsalgia, left foot: Secondary | ICD-10-CM | POA: Diagnosis not present

## 2024-03-15 DIAGNOSIS — M79672 Pain in left foot: Secondary | ICD-10-CM

## 2024-03-15 MED ORDER — DICLOFENAC SODIUM 1 % EX GEL: 2.0000 g | Freq: Four times a day (QID) | CUTANEOUS | 2 refills | Status: DC

## 2024-03-16 NOTE — Progress Notes (Signed)
  Subjective:  Patient ID: Randy Blake, male    DOB: 1956-05-24,  MRN: [id number]  Chief Complaint  Patient presents with   Foot Pain    Right foot pain in the ball of the foot pt stated that he has some tenderness when he flexes his toes     Discussed the use of AI scribe software for clinical note transcription with the patient, who gave verbal consent to proceed.  History of Present Illness Randy Blake is a 68 year old male who presents with pain in the ball of the foot.  The pain began after he slammed his foot into a metal lift while wearing flip flops two months ago. Initially severe, the pain improved over a month. Three weeks ago, a 'little knot' appeared, which started to resolve, but tenderness developed on the upper toes. The pain remains localized to the ball of the foot with a specific area of tenderness.  He has used black seed oil as a natural anti-inflammatory but experienced pressure and dizziness, possibly due to its blood pressure-lowering effects. He takes a turmeric pill daily. There is no numbness or tingling in the toes, and the toes are not significantly painful.      Objective:  There were no vitals filed for this visit.  Physical Exam General: AAO x3, NAD  Dermatological: Skin is warm, dry and supple bilateral. There are no open sores, no preulcerative lesions, no rash or signs of infection present.  Vascular: Dorsalis Pedis artery and Posterior Tibial artery pedal pulses are 2/4 bilateral with immedate capillary fill time.  There is no pain with calf compression, swelling, warmth, erythema.   Neruologic: Grossly intact via light touch bilateral.  No Tinel's sign.  Musculoskeletal: Moderate tenderness is localized on second interspace and submetatarsal 2 area.  There is no area of pinpoint tenderness but there is no pain the dorsal aspect of foot no edema dorsally.  Minimal edema to the submetatarsal area.  No pain with MPJ range of  motion.  Gait: Unassisted, Nonantalgic.     No images are attached to the encounter.    Results RADIOLOGY Foot X-ray: Healed fracture of the fifth toe with a small bone fragment; possible healed fracture of the second toe; no significant osteophytes; soft tissue inflammation in the joint area.   Assessment:   1. Capsulitis of left foot   2. Metatarsalgia, left foot      Plan:  Patient was evaluated and treated and all questions answered.  Assessment and Plan Assessment & Plan Left foot pain with soft tissue inflammation Chronic pain in the ball of the left foot due to soft tissue inflammation, likely involving muscle or tendon. X-rays show no major bone abnormalities, but an old fracture of the fifth toe is noted. - Apply Voltaren gel to the affected area twice daily. - Ice the affected area to reduce inflammation. - Use a gel pad to protect the area and alleviate pressure during walking. - Discussed meloxicam as an oral anti-inflammatory option; he prefers topical treatment. - Discussed steroid injection; he declined.   Return if symptoms worsen or fail to improve.   Donnice JONELLE Fees DPM

## 2024-03-31 ENCOUNTER — Encounter: Payer: Self-pay | Admitting: Medical

## 2024-03-31 ENCOUNTER — Ambulatory Visit: Admitting: Medical

## 2024-03-31 VITALS — BP 140/80 | HR 70 | Temp 97.9°F | Resp 17 | Ht 71.0 in | Wt 197.0 lb

## 2024-03-31 DIAGNOSIS — R519 Headache, unspecified: Secondary | ICD-10-CM

## 2024-03-31 DIAGNOSIS — R351 Nocturia: Secondary | ICD-10-CM

## 2024-03-31 DIAGNOSIS — D164 Benign neoplasm of bones of skull and face: Secondary | ICD-10-CM

## 2024-03-31 DIAGNOSIS — R110 Nausea: Secondary | ICD-10-CM | POA: Diagnosis not present

## 2024-03-31 DIAGNOSIS — R030 Elevated blood-pressure reading, without diagnosis of hypertension: Secondary | ICD-10-CM

## 2024-03-31 DIAGNOSIS — H579 Unspecified disorder of eye and adnexa: Secondary | ICD-10-CM | POA: Diagnosis not present

## 2024-03-31 DIAGNOSIS — E785 Hyperlipidemia, unspecified: Secondary | ICD-10-CM

## 2024-03-31 DIAGNOSIS — H539 Unspecified visual disturbance: Secondary | ICD-10-CM

## 2024-03-31 DIAGNOSIS — R944 Abnormal results of kidney function studies: Secondary | ICD-10-CM

## 2024-03-31 NOTE — Progress Notes (Signed)
 Subjective:    Patient ID: Randy Blake, male    DOB: [birth year], 68 y.o.   MRN: [id number]  HPI     I   [facility].    History of high cholesterol in the past. He states controlled with diet alone. Pt states statin caused achiness and fatigue in the past. Overall healthy diet and exercise. Rare occasional glass of wine.   Randy Blake is a 68 year old male who presents with a four-week history of head pressure and eye discomfort.  He has been experiencing pressure in the back of his head, specifically at the crown, for approximately four weeks. The pressure is described as a minor headache that is noticeable and persistent. Rapid head movements exacerbate the sensation, causing mild queasiness and occasional nausea, but not dizziness. No history of headaches prior to this episode. No fatigue or vomiting.  In addition to the head pressure, he has aching in both eyes, described as a strain-related ache. He wears two different pairs of glasses, one for regular use and another for reading, and is unsure if the symptoms are related to his eyewear. No vision changes, spots, or flashes of light. He mentions a history of eye pressure testing at the [facility], which was normal, and no history of glaucoma. His last eye examination was approximately a year and a half ago, possibly at the [facility].  He reports an increase in blood pressure over the past few weeks, with readings as high as 155/unknown, whereas his baseline is typically 120/80. He notes that his blood pressure varies depending on the position of his arm during measurement. He maintains a regular exercise routine and a consistent diet, and he has not experienced any significant lifestyle changes. Describes his bp has been consistent 140-150 systolic when he checks.  He maintains an active lifestyle, working seven days a week and exercising regularly. He has experienced a bad reaction to black seed oil,  which he discontinued. He consumes lemon juice and vitamins daily. 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 He is cautious about medications due to [employer] regulations affecting his career. So he wants to avoid any bp meds as he fears impact on his flying.     Review of Systems  Constitutional:  Negative for chills, fatigue and fever.  Eyes:        See hpi  Respiratory:  Negative for cough, chest tightness and wheezing.   Cardiovascular:  Negative for chest pain and palpitations.  Gastrointestinal:  Negative for abdominal pain and anal bleeding.  Genitourinary:  Negative for dysuria, frequency and hematuria.  Musculoskeletal:  Negative for back pain.  Neurological:  Positive for headaches. Negative for dizziness, syncope, weakness and numbness.  Hematological:  Negative for adenopathy.  Psychiatric/Behavioral:  Negative for behavioral problems and confusion.     Past Medical History:  Diagnosis Date   Hyperlipidemia      Social History   Socioeconomic History   Marital status: Married    Spouse name: Not on file   Number of children: Not on file   Years of education: Not on file   Highest education level: Not on file  Occupational History   Occupation: flight instructior  Tobacco Use   Smoking status: Never   Smokeless tobacco: Never  Vaping Use   Vaping status: Never Used  Substance and Sexual Activity   Alcohol use: Not on file  Drug use: Never   Sexual activity: Yes  Other Topics Concern   Not on file  Social History Narrative   Not on file   Social Drivers of Health   Financial Resource Strain: Not on file  Food Insecurity: Not on file  Transportation Needs: Not on file  Physical Activity: Not on file  Stress: Not on file  Social Connections: Not on file  Intimate Partner Violence: Not on file     No family history on file.  Allergies  Allergen Reactions   Zocor [Simvastatin]     Current Outpatient  Medications on File Prior to Visit  Medication Sig Dispense Refill   diclofenac Sodium (VOLTAREN) 1 % GEL Apply 2 g topically 4 (four) times daily. Rub into affected area of foot 2 to 4 times daily 100 g 2   doxycycline  (VIBRA -TABS) 100 MG tablet Take 1 tablet (100 mg total) by mouth 2 (two) times daily. Can give caps or generic 20 tablet 0   ezetimibe  (ZETIA ) 10 MG tablet Take 1 tablet (10 mg total) by mouth daily. 90 tablet 3   mupirocin  ointment (BACTROBAN ) 2 % Apply 1 Application topically 2 (two) times daily. 22 g 0   nystatin  ointment (MYCOSTATIN ) Apply 1 Application topically 2 (two) times daily. 30 g 0   No current facility-administered medications on file prior to visit.    BP (!) 140/80   Pulse 70   Temp 97.9 F (36.6 C) (Oral)   Resp 17   Ht 5' 11 (1.803 m)   Wt 197 lb (89.4 kg)   SpO2 98%   BMI 27.48 kg/m        Objective:   Physical Exam  General Mental Status- Alert. General Appearance- Not in acute distress.   Skin General: Color- Normal Color. Moisture- Normal Moisture.  Neck Carotid Arteries- Normal color. Moisture- Normal Moisture. No carotid bruits. No JVD.  Chest and Lung Exam Auscultation: Breath Sounds:-Normal.  Cardiovascular Auscultation:Rythm- Regular. Murmurs & Other Heart Sounds:Auscultation of the heart reveals- No Murmurs.  Abdomen Inspection:-Inspeection Normal. Palpation/Percussion:Note:No mass. Palpation and Percussion of the abdomen reveal- Non Tender, Non Distended + BS, no rebound or guarding.    Neurologic Cranial Nerve exam:- CN III-XII intact(No nystagmus), symmetric smile. Drift Test:- No drift. Romberg Exam:- Negative.  Heal to Toe Gait exam:-Normal. Finger to Nose:- Normal/Intact Strength:- 5/5 equal and symmetric strength both upper and lower extremities.       Assessment & Plan:   New onset headache and bilateral eye pressure Headache and eye pressure for four weeks, queasiness with rapid head movement.  Differential includes elevated blood pressure. CT scan considered due to age of onset. - Order CT scan of the head without contrast. - Refer to optometrist for eye examination.  Elevated blood pressure readings without diagnosis of hypertension Elevated blood pressure readings at home, historically normal. Discussed potential causes including age, genetics, and dietary factors. - Advise reducing salt intake and increasing dietary potassium. - Monitor blood pressure daily. - Schedule follow-up appointment in two weeks to review blood pressure readings.  Nocturia Frequent urination at night. - Order PSA test as part of future labs.  Hyperlipidemia. -cmp , cbc and lipid panel future fasting labs within a week.  Stevie Ertle, PA-C   I personally spent a total of 42 minutes in the care of the patient today including performing a medically appropriate exam/evaluation, counseling and educating, placing orders, referring and communicating with other health care professionals, and documenting clinical information in the EHR.

## 2024-03-31 NOTE — Patient Instructions (Signed)
 New onset headache and bilateral eye pressure Headache and eye pressure for four weeks, queasiness with rapid head movement. Differential includes elevated blood pressure. CT scan considered due to age of onset. - Order CT scan of the head without contrast. - Refer to optometrist for eye examination.  Elevated blood pressure readings without diagnosis of hypertension Elevated blood pressure readings at home, historically normal. Discussed potential causes including age, genetics, and dietary factors. - Advise reducing salt intake and increasing dietary potassium. - Monitor blood pressure daily. - Schedule follow-up appointment in two weeks to review blood pressure readings.  Nocturia Frequent urination at night. - Order PSA test as part of future labs.  Hyperlipidemia. -cmp , cbc and lipid panel future fasting labs within a week.

## 2024-04-02 NOTE — Progress Notes (Signed)
 THEOPLIS GARCIAGARCIA                                          MRN: [id number]   04/02/2024   The [facility] Quality Team Specialist reviewed this patient medical record for the purposes of chart review for care gap closure. The following were reviewed: chart review for care gap closure-colorectal cancer screening.    [facility] Quality Team

## 2024-04-04 ENCOUNTER — Ambulatory Visit (HOSPITAL_BASED_OUTPATIENT_CLINIC_OR_DEPARTMENT_OTHER)
Admission: RE | Admit: 2024-04-04 | Discharge: 2024-04-04 | Disposition: A | Source: Ambulatory Visit | Attending: Medical | Admitting: Medical

## 2024-04-04 DIAGNOSIS — R519 Headache, unspecified: Secondary | ICD-10-CM | POA: Insufficient documentation

## 2024-04-04 DIAGNOSIS — R110 Nausea: Secondary | ICD-10-CM | POA: Insufficient documentation

## 2024-04-04 DIAGNOSIS — H579 Unspecified disorder of eye and adnexa: Secondary | ICD-10-CM | POA: Diagnosis not present

## 2024-04-05 ENCOUNTER — Other Ambulatory Visit

## 2024-04-05 ENCOUNTER — Ambulatory Visit: Payer: Self-pay | Admitting: Medical

## 2024-04-05 DIAGNOSIS — R944 Abnormal results of kidney function studies: Secondary | ICD-10-CM | POA: Diagnosis not present

## 2024-04-05 DIAGNOSIS — R351 Nocturia: Secondary | ICD-10-CM | POA: Diagnosis not present

## 2024-04-05 DIAGNOSIS — R030 Elevated blood-pressure reading, without diagnosis of hypertension: Secondary | ICD-10-CM | POA: Diagnosis not present

## 2024-04-05 DIAGNOSIS — E785 Hyperlipidemia, unspecified: Secondary | ICD-10-CM | POA: Diagnosis not present

## 2024-04-05 LAB — CBC WITH DIFFERENTIAL/PLATELET
Basophils Absolute: 0 K/uL (ref 0.0–0.1)
Basophils Relative: 1 % (ref 0.0–3.0)
Eosinophils Absolute: 0.2 K/uL (ref 0.0–0.7)
Eosinophils Relative: 3.6 % (ref 0.0–5.0)
HCT: 42 % (ref 39.0–52.0)
Hemoglobin: 14.2 g/dL (ref 13.0–17.0)
Lymphocytes Relative: 32.5 % (ref 12.0–46.0)
Lymphs Abs: 1.6 K/uL (ref 0.7–4.0)
MCHC: 33.7 g/dL (ref 30.0–36.0)
MCV: 95.3 fl (ref 78.0–100.0)
Monocytes Absolute: 0.4 K/uL (ref 0.1–1.0)
Monocytes Relative: 8.3 % (ref 3.0–12.0)
Neutro Abs: 2.7 K/uL (ref 1.4–7.7)
Neutrophils Relative %: 54.6 % (ref 43.0–77.0)
Platelets: 236 K/uL (ref 150.0–400.0)
RBC: 4.41 Mil/uL (ref 4.22–5.81)
RDW: 12.1 % (ref 11.5–15.5)
WBC: 4.9 K/uL (ref 4.0–10.5)

## 2024-04-05 LAB — LIPID PANEL
Cholesterol: 300 mg/dL — ABNORMAL HIGH (ref 0–200)
HDL: 53.1 mg/dL (ref >39.00)
LDL Cholesterol: 216 mg/dL — ABNORMAL HIGH (ref 0–99)
NonHDL: 247
Total CHOL/HDL Ratio: 6
Triglycerides: 157 mg/dL — ABNORMAL HIGH (ref 0.0–149.0)
VLDL: 31.4 mg/dL (ref 0.0–40.0)

## 2024-04-05 LAB — COMPREHENSIVE METABOLIC PANEL WITH GFR
ALT: 11 U/L (ref 0–53)
AST: 12 U/L (ref 0–37)
Albumin: 4.6 g/dL (ref 3.5–5.2)
Alkaline Phosphatase: 47 U/L (ref 39–117)
BUN: 18 mg/dL (ref 6–23)
CO2: 31 meq/L (ref 19–32)
Calcium: 9.6 mg/dL (ref 8.4–10.5)
Chloride: 104 meq/L (ref 96–112)
Creatinine, Ser: 1.13 mg/dL (ref 0.40–1.50)
GFR: 65.99 mL/min (ref >60.00)
Glucose, Bld: 98 mg/dL (ref 70–99)
Potassium: 4.8 meq/L (ref 3.5–5.1)
Sodium: 142 meq/L (ref 135–145)
Total Bilirubin: 0.6 mg/dL (ref 0.2–1.2)
Total Protein: 6.9 g/dL (ref 6.0–8.3)

## 2024-04-05 LAB — PSA: PSA: 0.44 ng/mL (ref 0.10–4.00)

## 2024-04-05 NOTE — Addendum Note (Signed)
 Addended by: DORINA DALLAS HERO on: 04/05/2024 08:05 PM   Modules accepted: Orders

## 2024-04-05 NOTE — Addendum Note (Signed)
 Addended by: DORINA DALLAS HERO on: 04/05/2024 08:35 PM   Modules accepted: Orders

## 2024-04-07 ENCOUNTER — Encounter (HOSPITAL_BASED_OUTPATIENT_CLINIC_OR_DEPARTMENT_OTHER): Payer: Self-pay

## 2024-04-08 ENCOUNTER — Ambulatory Visit (INDEPENDENT_AMBULATORY_CARE_PROVIDER_SITE_OTHER): Admitting: Cardiology

## 2024-04-08 ENCOUNTER — Encounter (HOSPITAL_BASED_OUTPATIENT_CLINIC_OR_DEPARTMENT_OTHER): Payer: Self-pay | Admitting: Cardiology

## 2024-04-08 VITALS — BP 132/70 | HR 81 | Ht 71.0 in | Wt 199.2 lb

## 2024-04-08 DIAGNOSIS — Z7189 Other specified counseling: Secondary | ICD-10-CM

## 2024-04-08 DIAGNOSIS — E78011 Heterozygous familial hypercholesterolemia (hefh): Secondary | ICD-10-CM | POA: Diagnosis not present

## 2024-04-08 DIAGNOSIS — Z9189 Other specified personal risk factors, not elsewhere classified: Secondary | ICD-10-CM | POA: Diagnosis not present

## 2024-04-08 DIAGNOSIS — Z789 Other specified health status: Secondary | ICD-10-CM | POA: Diagnosis not present

## 2024-04-08 MED ORDER — ROSUVASTATIN CALCIUM 5 MG PO TABS: ORAL_TABLET | ORAL | 0 refills | Status: DC

## 2024-04-08 NOTE — Progress Notes (Signed)
 Cardiology Office Note:  .   Date:  04/08/2024  ID:  Ketrick, Matney [birth year], MRN [id number] PCP: Dorina Dallas RIGGERS  [facility] Providers Cardiologist:  Shelda Bruckner, MD {  History of Present Illness: .   JAKWAN SALLY is a 68 y.o. male with PMH hyperlipidemia. He is seen as a new patient evaluation at the request of Dallas Dorina, PA for evaluation of hyperlipidemia.  Referral from 04/05/24 reviewed. Referral requests Dr. Krasowski but he is scheduled to see me. Referred due to elevated cholesterol and high ASCVD risk, did not previously tolerate simvastatin, currently on ezetimibe . Patient is also followed through the [facility] system.  On review of his chart, lipids from 04/05/24 show Tchol 300, TG 157, HDL 52, LDL 216. LDL in 01/2023 was 237. No other results available.  Was followed at the [facility], told he had high cholesterol, was on simvastatin for a time but felt terrible on this. Went off all medication for a while. Has worked on diet, exercises daily. Still working 7 days/week.   He is not taking any medications right now. Had extensive discussion today on supplements, cholesterol meds, data, and risk reduction.  FH: no history of heart disease or stroke. Both parents died of old age in their 65s.  ROS: Denies chest pain, shortness of breath at rest or with normal exertion. No PND, orthopnea, LE edema or unexpected weight gain. No syncope or palpitations. ROS otherwise negative except as noted.   Studies Reviewed: SABRA    EKG:  EKG Interpretation Date/Time:  Thursday April 08 2024 15:10:50 EDT Ventricular Rate:  81 PR Interval:  168 QRS Duration:  98 QT Interval:  376 QTC Calculation: 436 R Axis:   6  Text Interpretation: Normal sinus rhythm Normal ECG Confirmed by Bruckner Shelda [phone]) on 04/08/2024 3:48:08 PM    Physical Exam:   VS:  BP 132/70 (BP Location: Right Arm, Patient Position: Sitting, Cuff Size: Normal)   Pulse 81   Ht 5'  11 (1.803 m)   Wt 199 lb 3.2 oz (90.4 kg)   SpO2 96%   BMI 27.78 kg/m    Wt Readings from Last 3 Encounters:  04/08/24 199 lb 3.2 oz (90.4 kg)  03/31/24 197 lb (89.4 kg)  01/20/23 197 lb 6.4 oz (89.5 kg)    GEN: Well nourished, well developed in no acute distress HEENT: Normal, moist mucous membranes NECK: No JVD CARDIAC: regular rhythm, normal S1 and S2, no rubs or gallops. No murmur. VASCULAR: Radial and DP pulses 2+ bilaterally. No carotid bruits RESPIRATORY:  Clear to auscultation without rales, wheezing or rhonchi  ABDOMEN: Soft, non-tender, non-distended MUSCULOSKELETAL:  Ambulates independently SKIN: Warm and dry, no edema NEUROLOGIC:  Alert and oriented x 3. No focal neuro deficits noted. PSYCHIATRIC:  Normal affect    ASSESSMENT AND PLAN: .    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 No strong FH but LDL consistent with HeFH -discussed data on supplements, cholesterol meds, and risk extensively -willing to trial a different statin. Will ramp, starting at 5 mg dose and gradually increasing to 20 mg dose. -once on 20 mg dose for 3 mos, check lipids/lfts -wants to avoid injections if possible, but we did discuss PCKS9i as backup -reviewed data on side effects, what to watch for  CV risk counseling and prevention -recommend heart healthy/Mediterranean diet, with whole grains, fruits, vegetable, fish, lean meats, nuts, and  olive oil. Limit salt. -recommend moderate walking, 3-5 times/week for 30-50 minutes each session. Aim for at least 150 minutes/week. Goal should be pace of 3 miles/hours, or walking 1.5 miles in 30 minutes -recommend avoidance of tobacco products. Avoid excess alcohol. -ASCVD risk score: The 10-year ASCVD risk score (Arnett DK, et al., 2019) is: 22.4%   Values used to calculate the score:     Age:  4 years     Clincally relevant sex: Male     Is Non-Hispanic African American: No     Diabetic: No     Tobacco smoker: No     Systolic Blood Pressure: 132 mmHg     Is BP treated: No     HDL Cholesterol: 53.1 mg/dL     Total Cholesterol: 300 mg/dL    Dispo: LFTs and lipids after 3 mos of steady therapy (about 4 mos total), follow up in 6 mos  Signed, Shelda Bruckner, MD   Shelda Bruckner, MD, PhD, [facility]  [facility]    Heart & Vascular at [facility] at [facility] [address], [address] [place], [place] [address] [phone]

## 2024-04-08 NOTE — Patient Instructions (Addendum)
 Recent data, summarized from NPR from a study from the [facility]:  cropwizard.com.pt  Researchers at the [place] [facility] set out to answer this question by comparing statins to supplements in a clinical trial. They tracked the outcomes of 190 adults, ages 100 to 1. Some participants were given a 5 mg daily dose of rosuvastatin, a statin that is sold under the brand name Crestor for 28 days. Others were given supplements, including fish oil, cinnamon, garlic, turmeric, plant sterols or red yeast rice for the same period.  What we found was that rosuvastatin lowered LDL cholesterol by almost 61% and that was vastly superior to placebo and any of the six supplements studied in the trial, study dino Herlene Hood, M.D. of the [facility] told NPR. He says this level of reduction is enough to lower the risk of heart attacks and strokes. The findings are published in the Journal of the [employer] of Cardiology.  Oftentimes these supplements are marketed as 'natural ways' to lower your cholesterol, says Laffin. But he says none of the dietary supplements demonstrated any significant decrease in LDL cholesterol compared with a placebo. LDL cholesterol is considered the 'bad cholesterol' because it can contribute to plaque build-up in the artery walls - which can narrow the arteries, and set the stage for heart attacks and strokes    -------------------------------------------------------------------------------------------------------------- When you are on the last week of the rosuvastatin, if it is going well on the 20 mg dose, send us  a message and then we will order the refill for one pill at 20 mg.   Fasting lipid panel and liver function panel in 4 months  Follow up Dr. Lonni or Reche Finder, NP or  Rosaline Bane, NP in 6 months

## 2024-04-12 DIAGNOSIS — H524 Presbyopia: Secondary | ICD-10-CM | POA: Diagnosis not present

## 2024-04-22 ENCOUNTER — Ambulatory Visit: Admitting: Medical

## 2024-04-22 ENCOUNTER — Telehealth: Payer: Self-pay | Admitting: Medical

## 2024-04-22 ENCOUNTER — Other Ambulatory Visit: Payer: Self-pay

## 2024-04-22 VITALS — BP 122/80 | HR 74 | Temp 97.6°F | Resp 16 | Ht 71.0 in | Wt 200.8 lb

## 2024-04-22 DIAGNOSIS — D164 Benign neoplasm of bones of skull and face: Secondary | ICD-10-CM

## 2024-04-22 DIAGNOSIS — E785 Hyperlipidemia, unspecified: Secondary | ICD-10-CM

## 2024-04-22 DIAGNOSIS — J309 Allergic rhinitis, unspecified: Secondary | ICD-10-CM

## 2024-04-22 MED ORDER — FLUTICASONE PROPIONATE 50 MCG/ACT NA SUSP: 2.0000 | Freq: Every day | NASAL | 6 refills | Status: AC

## 2024-04-22 NOTE — Patient Instructions (Addendum)
 Hyperlipidemia Chronic hyperlipidemia with high ASCVD risk. Previous adverse reaction to simvastatin, currently on low dose rosuvastatin gradual dose escalation. - Continue rosuvastatin with gradual dose escalation. - Follow up in 3 months with fasting lipid panel.  Benign osteoma of left ethmoid sinus Incidental asymptomatic benign osteoma in left ethmoid sinus. Referred to ENT for evaluation. - Provided ENT referral information. - Reassured regarding benign nature of osteoma.  Allergic rhinitis Chronic nasal drainage and postnasal drip due to allergic rhinitis. Flonase prescribed to manage symptoms. - Prescribed Flonase nasal spray.  Follow up 3 months or sooner if needed     Referring To Provider Information [facility] [address] [place] [address] [phone]   Referral Start Date: 04/05/2024 Referral End Date: 04/05/2025

## 2024-04-22 NOTE — Telephone Encounter (Signed)
 Pt is asking about the Flonase that was discussed at his appointment today. He was waiting at the pharmacy.

## 2024-04-22 NOTE — Telephone Encounter (Signed)
 Looked over office notes and indeed the Flonase was supposed to be prescribed by pcp Called pt to verify witch pharmacy he wants the rx to be sent to and was able to send over the Flonase for the pt

## 2024-04-22 NOTE — Progress Notes (Unsigned)
 Subjective:    Patient ID: Randy Blake, male    DOB: [birth year], 68 y.o.   MRN: [id number]  HPI  Randy Blake is a 68 year old male who presents for a review of his recent lab results, particularly concerning his elevated cholesterol levels.  His total cholesterol was recorded at 300 mg/dL two weeks ago, consistent with previous results from a year ago at 307 mg/dL. His LDL cholesterol was 216 mg/dL. Triglycerides have improved, decreasing from nearly 300 mg/dL a year ago to 842 mg/dL, slightly above the upper limit of normal.  During an eye exam, plaque buildup was observed in the veins of his eyes. He previously had a reaction to simvasttin, but is now taking crestor.  He underwent a CT scan of the head due to previous pressure sensations on the crown of his head and vision issues, which have since resolved. The scan was normal except for a small osteoma in the left ethmoid sinus.  He experiences chronic sinus drainage and carries tissues regularly. 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 No current headaches and no nasal congestion, but ongoing postnasal drainage.  His CBC was normal, with no anemia or abnormal platelet counts, and his PSA was low at 0.44. Kidney function, liver enzymes, and blood sugar levels were within normal ranges. An EKG performed recently showed normal sinus rhythm. His blood pressure has improved since his last visit.    Review of Systems  Constitutional:  Negative for chills, fatigue and fever.  HENT:  Positive for rhinorrhea. Negative for congestion, sinus pressure and sneezing.   Respiratory:  Negative for cough, chest tightness, shortness of breath and wheezing.   Cardiovascular:  Negative for palpitations.  Gastrointestinal:  Negative for abdominal pain.  Genitourinary:  Negative for dysuria and penile swelling.  Musculoskeletal:  Negative for back pain.  Skin:  Negative  for rash.  Neurological:  Negative for dizziness, syncope and light-headedness.  Psychiatric/Behavioral:  Negative for behavioral problems.     Past Medical History:  Diagnosis Date   Hyperlipidemia      Social History   Socioeconomic History   Marital status: Married    Spouse name: Not on file   Number of children: Not on file   Years of education: Not on file   Highest education level: Not on file  Occupational History   Occupation: flight instructior  Tobacco Use   Smoking status: Never   Smokeless tobacco: Never  Vaping Use   Vaping status: Never Used  Substance and Sexual Activity   Alcohol use: Not on file   Drug use: Never   Sexual activity: Yes  Other Topics Concern   Not on file  Social History Narrative   Not on file   Social Drivers of Health   Financial Resource Strain: Not on file  Food Insecurity: Not on file  Transportation Needs: Not on file  Physical Activity: Not on file  Stress: Not on file  Social Connections: Not on file  Intimate Partner Violence: Not on file    No past surgical history on file.  No family history on file.  Allergies  Allergen Reactions   Zocor [Simvastatin]     Current Outpatient Medications on File Prior to Visit  Medication Sig Dispense Refill   rosuvastatin (CRESTOR) 5 MG tablet Take 1 tablet (5 mg total) by mouth daily for 14 days, THEN 2 tablets (10 mg total) daily for  14 days, THEN 4 tablets (20 mg total) daily for 14 days. 98 tablet 0   No current facility-administered medications on file prior to visit.    BP 122/80   Pulse 74   Temp 97.6 F (36.4 C) (Oral)   Resp 16   Ht 5' 11 (1.803 m)   Wt 200 lb 12.8 oz (91.1 kg)   SpO2 97%   BMI 28.01 kg/m         Objective:   Physical Exam  General- No acute distress. Pleasant patient. Neck- Full range of motion, no jvd Lungs- Clear, even and unlabored. Heart- regular rate and rhythm. Neurologic- CNII- XII grossly intact.       Assessment &  Plan:  Hyperlipidemia Chronic hyperlipidemia with high ASCVD risk. Previous adverse reaction to simvastatin, currently on low dose rosuvastatin gradual dose escalation. - Continue rosuvastatin with gradual dose escalation. - Follow up in 3 months with fasting lipid panel.  Benign osteoma of left ethmoid sinus Incidental asymptomatic benign osteoma in left ethmoid sinus. Referred to ENT for evaluation. - Provided ENT referral information. - Reassured regarding benign nature of osteoma.  Allergic rhinitis Chronic nasal drainage and postnasal drip due to allergic rhinitis. Flonase prescribed to manage symptoms. - Prescribed Flonase nasal spray.  Follow up 3 months or sooner if needed

## 2024-05-11 ENCOUNTER — Institutional Professional Consult (permissible substitution) (INDEPENDENT_AMBULATORY_CARE_PROVIDER_SITE_OTHER): Admitting: Otolaryngology

## 2024-05-11 NOTE — Progress Notes (Deleted)
 Dear Dr. Dorina, Here is my assessment for our mutual patient, Randy Blake. Thank you for allowing me the opportunity to care for your patient. Please do not hesitate to contact me should you have any other questions. Sincerely, Dr. Eldora Blanch  Otolaryngology Clinic Note Referring provider: Dr. Dorina HPI:  Randy Blake is a 69 y.o. male kindly referred by Dr. 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  History of Present Illness     H&N Surgery: *** Personal or FHx of bleeding dz or anesthesia difficulty: no ***  GLP-1: *** AP/AC: ***  Tobacco: ***. Alcohol: ***. Occupation: ***. Lives in *** with ***.  Independent Review of Additional Tests or Records:  Edward Saguier (03/31/2024):   PMH/Meds/All/SocHx/FamHx/ROS:   Past Medical History:  Diagnosis Date   Hyperlipidemia      No past surgical history on file.  No family history on file.   Social Connections: Not on file      Current Outpatient Medications:    fluticasone  (FLONASE ) 50 MCG/ACT nasal spray, Place 2 sprays into both nostrils daily., Disp: 16 g, Rfl: 6   rosuvastatin  (CRESTOR ) 5 MG tablet, Take 1 tablet (5 mg total) by mouth daily for 14 days, THEN 2 tablets (10 mg total) daily for 14 days, THEN 4 tablets (20 mg total) daily for 14 days., Disp: 98 tablet, Rfl: 0   Physical Exam:   There were no vitals taken for this visit.  Salient findings:  CN II-XII intact *** Bilateral EAC clear and TM intact with well pneumatized middle ear spaces Weber 512: *** Rinne 512: AC > BC b/l *** Rine 1024: AC > BC b/l *** Anterior rhinoscopy: Septum ***; bilateral inferior turbinates with *** No lesions of oral cavity/oropharynx; dentition *** No obviously palpable neck masses/lymphadenopathy/thyromegaly No respiratory distress or  stridor***  Seprately Identifiable Procedures:  Prior to initiating any procedures, risks/benefits/alternatives were explained to the patient and verbal consent obtained. None***  Impression & Plans:  Randy Blake is a 68 y.o. male with ***  No diagnosis found.   - f/u ***  See below regarding exact medications prescribed this encounter including dosages and route: No orders of the defined types were placed in this encounter.     Thank you for allowing me the opportunity to care for your patient. Please do not hesitate to contact me should you have any other questions.  Sincerely, Eldora Blanch, MD Otolaryngologist (ENT), [facility] Phone: [phone] Fax: [phone]  05/11/2024, 8:07 AM   MDM:  Level *** Complexity/Problems addressed: *** Data complexity: *** independent review of *** - Morbidity: ***  - Prescription Drug prescribed or managed: ***

## 2024-06-07 ENCOUNTER — Other Ambulatory Visit: Payer: Self-pay | Admitting: Family

## 2024-06-07 ENCOUNTER — Encounter: Payer: Self-pay | Admitting: Medical

## 2024-06-07 MED ORDER — ROSUVASTATIN CALCIUM 20 MG PO TABS: 20.0000 mg | ORAL_TABLET | Freq: Every day | ORAL | 3 refills | Status: AC

## 2024-06-08 ENCOUNTER — Other Ambulatory Visit (HOSPITAL_BASED_OUTPATIENT_CLINIC_OR_DEPARTMENT_OTHER): Payer: Self-pay | Admitting: Cardiology

## 2024-06-08 DIAGNOSIS — E78011 Heterozygous familial hypercholesterolemia (hefh): Secondary | ICD-10-CM

## 2024-07-13 ENCOUNTER — Ambulatory Visit (HOSPITAL_BASED_OUTPATIENT_CLINIC_OR_DEPARTMENT_OTHER)
Admission: RE | Admit: 2024-07-13 | Discharge: 2024-07-13 | Disposition: A | Source: Ambulatory Visit | Attending: Medical | Admitting: Medical

## 2024-07-13 ENCOUNTER — Encounter: Payer: Self-pay | Admitting: Medical

## 2024-07-13 ENCOUNTER — Ambulatory Visit: Admitting: Medical

## 2024-07-13 VITALS — BP 132/90 | HR 82 | Temp 97.9°F | Ht 71.0 in | Wt 198.0 lb

## 2024-07-13 DIAGNOSIS — R0781 Pleurodynia: Secondary | ICD-10-CM

## 2024-07-13 DIAGNOSIS — E785 Hyperlipidemia, unspecified: Secondary | ICD-10-CM

## 2024-07-13 NOTE — Progress Notes (Signed)
 "  Subjective:    Patient ID: Randy Blake, male    DOB: Sep 16, 1955, 69 y.o.   MRN: [id number]  HPI  Discussed the use of AI scribe software for clinical note transcription with the patient, who gave verbal consent to proceed.  History of Present Illness   Randy Blake is a 69 year old male who presents with an intermittent gassy sensation in the right lower rib area.  For the past month, he has had an intermittent gassy pressure in the right lower rib area, described as gas trapped. It occurs randomly, sometimes waking him at night, and is not related to deep breathing or specific activities. He denies pain, only pressure. No injury or fall. No leg pain. No swelling and not calf pain.  He denies injury or trauma to the area, stomach pain, nausea, vomiting, fevers, chills, sweats, or cough. He is able to maintain his usual exercise routine and diet.  He has taken rosuvastatin  (Crestor ) since June 07, 2024, titrated to 20 mg daily, and wonders if it could be related to his symptoms, though he has no muscle pain or other typical statin side effects. He has never smoked and reports no family lung disease. Labs from October showed normal liver enzymes, alkaline phosphatase, and CBC.   No skin rash noted.           Review of Systems  Constitutional:  Negative for chills, fatigue and fever.  HENT:  Negative for congestion.   Respiratory:  Negative for cough, chest tightness, shortness of breath, wheezing and stridor.        Rt lower rib/posterior rib area/lung field gas sensation.  Cardiovascular:  Negative for chest pain and palpitations.  Gastrointestinal:  Negative for abdominal distention, abdominal pain, constipation, diarrhea, nausea, rectal pain and vomiting.  Genitourinary:  Negative for dysuria and frequency.  Musculoskeletal:  Negative for back pain.  Skin:  Negative for rash.  Neurological:  Negative for dizziness, weakness and headaches.  Hematological:   Negative for adenopathy.  Psychiatric/Behavioral:  Negative for behavioral problems and dysphoric mood.     Past Medical History:  Diagnosis Date   Hyperlipidemia      Social History   Socioeconomic History   Marital status: Married    Spouse name: Not on file   Number of children: Not on file   Years of education: Not on file   Highest education level: Not on file  Occupational History   Occupation: flight instructior  Tobacco Use   Smoking status: Never   Smokeless tobacco: Never  Vaping Use   Vaping status: Never Used  Substance and Sexual Activity   Alcohol use: Not on file   Drug use: Never   Sexual activity: Yes  Other Topics Concern   Not on file  Social History Narrative   Not on file   Social Drivers of Health   Tobacco Use: Low Risk (07/13/2024)   Patient History    Smoking Tobacco Use: Never    Smokeless Tobacco Use: Never    Passive Exposure: Not on file  Financial Resource Strain: Not on file  Food Insecurity: Not on file  Transportation Needs: Not on file  Physical Activity: Not on file  Stress: Not on file  Social Connections: Not on file  Intimate Partner Violence: Not on file  Depression (EYV7-0): Low Risk (07/13/2024)   Depression (PHQ2-9)    PHQ-2 Score: 0  Alcohol Screen: Not on file  Housing: Not on file  Utilities: Not on  file  Health Literacy: Not on file    No past surgical history on file.  No family history on file.  Allergies[1]  Medications Ordered Prior to Encounter[2]  BP (!) 132/90 (BP Location: Left Arm, Patient Position: Sitting, Cuff Size: Normal)   Pulse 82   Temp 97.9 F (36.6 C) (Oral)   Ht 5' 11 (1.803 m)   Wt 198 lb (89.8 kg)   SpO2 97%   BMI 27.62 kg/m           Objective:   Physical Exam  General Mental Status- Alert. General Appearance- Not in acute distress.   Skin Rt posterior rib area on inspection no rash or vesicles. No pain on palpation.  Neck Carotid Arteries- Normal color. Moisture-  Normal Moisture. No carotid bruits. No JVD. No tracheal deviation.  Chest and Lung Exam Auscultation: Breath Sounds:-CTA  Cardiovascular Auscultation:Rythm- RRR Murmurs & Other Heart Sounds:Auscultation of the heart reveals- No Murmurs.  Abdomen Inspection:-Inspeection Normal. Palpation/Percussion:Note:No mass. Palpation and Percussion of the abdomen reveal- Non Tender, Non Distended + BS, no rebound or guarding.    Neurologic Cranial Nerve exam:- CN III-XII intact(No nystagmus), symmetric smile. Strength:- 5/5 equal and symmetric strength both upper and lower extremities.   Lower ext- calfs symmetric, negative homans sign. No pedal edema bilaterally.    Assessment & Plan:   Pleuritic pain Intermittent probably atypical pleuritic pain in right lower rib area for a month. Differential includes pleuritic, muscle, or rib pain. Unlikely related to rosuvastatin . - Ordered chest x-ray with right rib series.stat - Ordered metabolic panel and cbc - Advised to report any changes or worsening of symptoms.  Hyperlipidemia Managed with rosuvastatin  20 mg daily. No significant side effects. - Continue rosuvastatin  20 mg daily. -cbc and cmp today - Order fasting lipid panel in 1-2 weeks. - Advised to maintain current lifestyle and medication regimen. (might consider brief dc of Rosuvastatin  after lipid panel done in future to see if signs/symptoms resolve) though do doubt above is side effect  Follow up date to be determined after lab review  Dallas Maxwell, PA-C     [1]  Allergies Allergen Reactions   Zocor [Simvastatin]   [2]  Current Outpatient Medications on File Prior to Visit  Medication Sig Dispense Refill   fluticasone  (FLONASE ) 50 MCG/ACT nasal spray Place 2 sprays into both nostrils daily. (Patient not taking: Reported on 07/13/2024) 16 g 6   rosuvastatin  (CRESTOR ) 20 MG tablet Take 1 tablet (20 mg total) by mouth daily. 90 tablet 3   No current facility-administered  medications on file prior to visit.   "

## 2024-07-14 ENCOUNTER — Ambulatory Visit: Payer: Self-pay | Admitting: Medical

## 2024-07-14 LAB — COMPREHENSIVE METABOLIC PANEL WITH GFR
ALT: 14 U/L (ref 3–53)
AST: 18 U/L (ref 5–37)
Albumin: 4.7 g/dL (ref 3.5–5.2)
Alkaline Phosphatase: 51 U/L (ref 39–117)
BUN: 17 mg/dL (ref 6–23)
CO2: 25 meq/L (ref 19–32)
Calcium: 9.5 mg/dL (ref 8.4–10.5)
Chloride: 104 meq/L (ref 96–112)
Creatinine, Ser: 1.09 mg/dL (ref 0.40–1.50)
GFR: 68.78 mL/min
Glucose, Bld: 85 mg/dL (ref 70–99)
Potassium: 4.3 meq/L (ref 3.5–5.1)
Sodium: 140 meq/L (ref 135–145)
Total Bilirubin: 0.5 mg/dL (ref 0.2–1.2)
Total Protein: 6.9 g/dL (ref 6.0–8.3)

## 2024-07-14 LAB — CBC WITH DIFFERENTIAL/PLATELET
Basophils Absolute: 0.2 10*3/uL — ABNORMAL HIGH (ref 0.0–0.1)
Basophils Relative: 2.4 % (ref 0.0–3.0)
Eosinophils Absolute: 0.1 10*3/uL (ref 0.0–0.7)
Eosinophils Relative: 2.2 % (ref 0.0–5.0)
HCT: 40 % (ref 39.0–52.0)
Hemoglobin: 13.6 g/dL (ref 13.0–17.0)
Lymphocytes Relative: 29 % (ref 12.0–46.0)
Lymphs Abs: 1.9 10*3/uL (ref 0.7–4.0)
MCHC: 34 g/dL (ref 30.0–36.0)
MCV: 93.2 fl (ref 78.0–100.0)
Monocytes Absolute: 0.5 10*3/uL (ref 0.1–1.0)
Monocytes Relative: 7.7 % (ref 3.0–12.0)
Neutro Abs: 3.8 10*3/uL (ref 1.4–7.7)
Neutrophils Relative %: 58.7 % (ref 43.0–77.0)
Platelets: 233 10*3/uL (ref 150.0–400.0)
RBC: 4.29 Mil/uL (ref 4.22–5.81)
RDW: 12.1 % (ref 11.5–15.5)
WBC: 6.6 10*3/uL (ref 4.0–10.5)

## 2024-07-22 ENCOUNTER — Ambulatory Visit: Admitting: Medical

## 2024-07-23 ENCOUNTER — Ambulatory Visit: Admitting: Medical
